# Patient Record
Sex: Male | Born: 1990 | Race: White | Hispanic: No | Marital: Single | Smoking: Never smoker
Health system: Southern US, Community
[De-identification: ages and names within clinical notes are randomized; demographics above are authoritative.]

---
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
## 2011-12-30 ENCOUNTER — Ambulatory Visit (HOSPITAL_COMMUNITY)
Admission: RE | Admit: 2011-12-30 | Discharge: 2011-12-30 | Disposition: A | Discharge status: Home / Self Care | Payer: 59 | Source: Ambulatory Visit | Attending: Family Medicine | Admitting: Family Medicine

## 2011-12-30 DIAGNOSIS — R059 Cough, unspecified: Secondary | ICD-10-CM | POA: Insufficient documentation

## 2011-12-30 DIAGNOSIS — R05 Cough: Secondary | ICD-10-CM | POA: Insufficient documentation

## 2011-12-30 MED ORDER — ALBUTEROL SULFATE (5 MG/ML) 0.5% IN NEBU: 2.5000 mg | INHALATION_SOLUTION | Freq: Once | RESPIRATORY_TRACT | Status: DC

## 2017-01-26 ENCOUNTER — Emergency Department (HOSPITAL_COMMUNITY)
Admission: EM | Admit: 2017-01-26 | Discharge: 2017-01-26 | Disposition: A | Discharge status: Home / Self Care | Payer: Worker's Compensation | Attending: Emergency Medicine | Admitting: Emergency Medicine

## 2017-01-26 ENCOUNTER — Other Ambulatory Visit: Payer: Self-pay | Admitting: Occupational Medicine

## 2017-01-26 ENCOUNTER — Ambulatory Visit: Payer: Self-pay

## 2017-01-26 ENCOUNTER — Encounter (HOSPITAL_COMMUNITY): Payer: Self-pay

## 2017-01-26 DIAGNOSIS — W268XXA Contact with other sharp object(s), not elsewhere classified, initial encounter: Secondary | ICD-10-CM | POA: Diagnosis not present

## 2017-01-26 DIAGNOSIS — Y9289 Other specified places as the place of occurrence of the external cause: Secondary | ICD-10-CM | POA: Diagnosis not present

## 2017-01-26 DIAGNOSIS — S81021A Laceration with foreign body, right knee, initial encounter: Secondary | ICD-10-CM | POA: Insufficient documentation

## 2017-01-26 DIAGNOSIS — Y999 Unspecified external cause status: Secondary | ICD-10-CM | POA: Diagnosis not present

## 2017-01-26 DIAGNOSIS — S80251A Superficial foreign body, right knee, initial encounter: Secondary | ICD-10-CM

## 2017-01-26 DIAGNOSIS — Y9389 Activity, other specified: Secondary | ICD-10-CM | POA: Diagnosis not present

## 2017-01-26 DIAGNOSIS — M25562 Pain in left knee: Secondary | ICD-10-CM

## 2017-01-26 MED ORDER — IBUPROFEN 400 MG PO TABS
600.0000 mg | ORAL_TABLET | Freq: Once | ORAL | Status: AC
  Administered 2017-01-26: 600 mg via ORAL
  Filled 2017-01-26: qty 1

## 2017-01-26 MED ORDER — LIDOCAINE HCL (PF) 1 % IJ SOLN
30.0000 mL | Freq: Once | INTRAMUSCULAR | Status: AC
  Administered 2017-01-26: 15 mL
  Filled 2017-01-26: qty 30

## 2017-01-26 MED ORDER — CEPHALEXIN 250 MG PO CAPS
500.0000 mg | ORAL_CAPSULE | Freq: Once | ORAL | Status: AC
  Administered 2017-01-26: 500 mg via ORAL
  Filled 2017-01-26: qty 2

## 2017-01-26 MED ORDER — LIDOCAINE HCL (PF) 1 % IJ SOLN
INTRAMUSCULAR | Status: AC
  Filled 2017-01-26: qty 15

## 2017-01-26 MED ORDER — CEPHALEXIN 500 MG PO CAPS: 500.0000 mg | ORAL_CAPSULE | Freq: Four times a day (QID) | ORAL | 0 refills | Status: DC

## 2017-01-26 NOTE — Discharge Instructions (Signed)
Your exam and x-ray today showed a foreign body in the soft tissue of your knee. We have removed the foreign body. You will need to take antibiotics in an effort to prevent infection. You will need to call Dr. Greig Right office to schedule follow up for Thursday. If you develop fever, increased redness, red streaks increased pain drainage from the wound or other problems, return immediately. Take ibuprofen as needed for pain.

## 2017-01-26 NOTE — ED Provider Notes (Signed)
[facility]-EMERGENCY DEPT Provider Note   CSN: [id number] Arrival date & time: 01/26/17  1630  By signing my name below, I, Linna Darner, attest that this documentation has been prepared under the direction and in the presence of [facility] M. Damian Leavell, NP. Electronically Signed: Linna Darner, Scribe. 01/26/2017. 5:23 PM.  History   Chief Complaint Chief Complaint  Patient presents with  . Knee Injury    The history is provided by the patient. No language interpreter was used.  Knee Pain   This is a new problem. The current episode started 3 to 5 hours ago. The problem occurs constantly. The problem has not changed since onset.The pain is present in the left knee. The quality of the pain is described as constant. The pain is mild. Pertinent negatives include no numbness and no tingling. He has tried nothing for the symptoms. There has been no history of extremity trauma. Family history is significant for no rheumatoid arthritis.     HPI Comments: Marcus Rosario is a 26 y.o. male who presents to the Emergency Department complaining of a left knee injury sustained a few hours ago at work. He states a shard of metal went into his left patella and lacerated the area. Pt reports some initial bleeding from the wound site which is now hemostatic as well as some mild pain secondary to the wound. He states his pain is worse with flexion of his left knee. No medications or treatments tried PTA. He was evaluated at Urgent Care earlier for the same and was advised to come to the ED after an x-ray showed radiodensities within the ventral soft tissues around the left knee. Tetanus status is UTD. He denies numbness/tingling, calf pain, or any other associated symptoms.  History reviewed. No pertinent past medical history.  There are no active problems to display for this patient.   History reviewed. No pertinent surgical history.    Home Medications    Prior to Admission medications   Medication Sig Start  Date End Date Taking? Authorizing Provider  cephALEXin (KEFLEX) 500 MG capsule Take 1 capsule (500 mg total) by mouth 4 (four) times daily. 01/26/17   Birdia Jaycox Orlene Och, NP    Family History No family history on file.  Social History Social History  Substance Use Topics  . Smoking status: Never Smoker  . Smokeless tobacco: Never Used  . Alcohol use Yes     Comment: Occasional      Allergies   Patient has no known allergies.   Review of Systems Review of Systems  Musculoskeletal: Positive for arthralgias. Negative for myalgias.  Skin: Positive for wound.  Neurological: Negative for tingling and numbness.  All other systems reviewed and are negative.  Physical Exam Updated Vital Signs BP (!) 145/64 (BP Location: Right Arm)   Pulse 63   Temp 98.4 F (36.9 C) (Oral)   Resp 16   Ht  (1.803 m)   Wt 79.4 kg   SpO2 99%   BMI 24.41 kg/m   Physical Exam  Constitutional: He is oriented to person, place, and time. He appears well-developed and well-nourished. No distress.  HENT:  Head: Normocephalic and atraumatic.  Eyes: Conjunctivae and EOM are normal.  Neck: Neck supple. No tracheal deviation present.  Cardiovascular: Normal rate.   Pulmonary/Chest: Effort normal.  Musculoskeletal: Normal range of motion.  1.5 cm laceration to the inferior aspect of the left patella. Pedal pulses 2+. Adequate circulation. Can fully extend the knee without pain but does have pain  with flexion. Foreign body is not visible at the entrance wound, there is no exit wound.  Neurological: He is alert and oriented to person, place, and time.  Skin: Skin is warm and dry. Laceration noted.  Psychiatric: He has a normal mood and affect. His behavior is normal.  Nursing note and vitals reviewed.  ED Treatments / Results: consult with Dr. Eulah Pont who request we remove the FB, irrigate with NSS and start antibiotics. He will see the patient for f/u in the office in 2 days.   Labs (all labs ordered  are listed, but only abnormal results are displayed) Labs Reviewed - No data to display  Radiology Dg Knee Complete 4 Views Left  Result Date: 01/26/2017 CLINICAL DATA:  Traumatic injury to the left knee. Evaluate for foreign body. EXAM: LEFT KNEE - COMPLETE 4+ VIEW COMPARISON:  None. FINDINGS: No joint effusion. There is a radio-opaque foreign body within the soft tissues of the ventral knee adjacent to or within the patellar tendon. No underlying fracture or subluxation identified. Three additional punctate radiodensities are identified within the ventral suprapatellar soft tissues. IMPRESSION: 1. Examination is positive for at least 4 radiodensities within the ventral soft tissues around the knee. The largest is either within or adjacent to the patellar tendon. Electronically Signed   By: Signa Kell M.D.   On: 01/26/2017 16:16    Procedures .Foreign Body Removal Date/Time: 01/26/2017 6:23 PM Performed by: Janne Napoleon Authorized by: Janne Napoleon  Consent: Verbal consent obtained. Risks and benefits: risks, benefits and alternatives were discussed Consent given by: patient Patient understanding: patient states understanding of the procedure being performed Imaging studies: imaging studies available Required items: required blood products, implants, devices, and special equipment available Patient identity confirmed: verbally with patient Body area: skin General location: lower extremity Location details: left knee Anesthesia: local infiltration  Anesthesia: Local Anesthetic: lidocaine 1% without epinephrine Anesthetic total: 7 mL  Sedation: Patient sedated: no Patient restrained: no Patient cooperative: yes Localization method: probed Removal mechanism: forceps Tendon involvement: none Depth: deep Complexity: complex 1 objects recovered. Objects recovered: 2 cm piece of metal Post-procedure assessment: foreign body removed Patient tolerance: Patient tolerated the  procedure well with no immediate complications Comments: Wound irrigated with 1,000 ccs NSS using a syringe. Wound left open. Tetanus status UTD. Will start on Keflex.   (including critical care time)  DIAGNOSTIC STUDIES: Oxygen Saturation is 98% on RA, normal by my interpretation.    COORDINATION OF CARE: 5:28 PM Discussed treatment plan with pt at bedside and pt agreed to plan.  Medications Ordered in ED Medications  lidocaine (PF) (XYLOCAINE) 1 % injection 30 mL (15 mLs Infiltration Given 01/26/17 1809)  cephALEXin (KEFLEX) capsule 500 mg (500 mg Oral Given 01/26/17 1853)  ibuprofen (ADVIL,MOTRIN) tablet 600 mg (600 mg Oral Given 01/26/17 1853)     Initial Impression / Assessment and Plan / ED Course  I have reviewed the triage vital signs and the nursing notes.  Pertinent  imaging results that were available during my care of the patient were reviewed by me and considered in my medical decision making (see chart for details).    Final Clinical Impressions(s) / ED Diagnoses  26 y.o. male with FB to the left knee resulting from injury at work stable for d/c without focal neuro deficits and foreign body removed. Discussed with the patient findings and plan of care and all questioned fully answered. He agrees with plan.   Final diagnoses:  Foreign body of  knee, right, initial encounter    New Prescriptions Discharge Medication List as of 01/26/2017  6:39 PM    START taking these medications   Details  cephALEXin (KEFLEX) 500 MG capsule Take 1 capsule (500 mg total) by mouth 4 (four) times daily., Starting Tue 01/26/2017, Print       I personally performed the services described in this documentation, which was scribed in my presence. The recorded information has been reviewed and is accurate.    [address] [place], [place] 01/27/17 5621    Maia Plan, MD 01/27/17 [phone]

## 2017-01-26 NOTE — ED Triage Notes (Signed)
Per Pt, Pt was at work when a shard of metal went into his left knee cap. Pt reports going to UC and being sent here for the removal of the metal. Reports they took an XRAY and metal was right below the surface of the skin.

## 2017-01-29 ENCOUNTER — Emergency Department (HOSPITAL_COMMUNITY)
Admission: EM | Admit: 2017-01-29 | Discharge: 2017-01-29 | Disposition: A | Discharge status: Home / Self Care | Payer: Worker's Compensation | Attending: Physician Assistant | Admitting: Physician Assistant

## 2017-01-29 ENCOUNTER — Encounter (HOSPITAL_COMMUNITY): Payer: Self-pay | Admitting: *Deleted

## 2017-01-29 DIAGNOSIS — Z4801 Encounter for change or removal of surgical wound dressing: Secondary | ICD-10-CM | POA: Insufficient documentation

## 2017-01-29 DIAGNOSIS — Z5189 Encounter for other specified aftercare: Secondary | ICD-10-CM

## 2017-01-29 NOTE — ED Notes (Signed)
PA at the bedside.

## 2017-01-29 NOTE — ED Provider Notes (Signed)
[facility]   CSN: [id number] Arrival date & time: 01/29/17  1050  By signing my name below, I, [name], attest that this documentation has been prepared under the direction and in the presence of Buel Ream PA-C Electronically Signed: [name], Scribe. 01/29/17. 2:32 PM. History   Chief Complaint No chief complaint on file.   HPI Comments: Marcus Rosario is a 26 y.o. male who presents to the Emergency Department requesting wound check today. He was seen on 01/26/17 c/o a left knee injury sustained at work when a shard of metal entered his left patella and lacerated the knee. He has minimal pain that is present with walking for extended periods of time. He states he had an appointment with Dr. Eulah Pont at 11:00 am today but misunderstood the location and accidentally came to the ED. He denies associated fever, numbness or tingling.  The history is provided by the patient. No language interpreter was used.    History reviewed. No pertinent past medical history.  There are no active problems to display for this patient.   History reviewed. No pertinent surgical history.     Home Medications    Prior to Admission medications   Medication Sig Start Date End Date Taking? Authorizing Provider  cephALEXin (KEFLEX) 500 MG capsule Take 1 capsule (500 mg total) by mouth 4 (four) times daily. 01/26/17   Hope Orlene Och, NP    Family History No family history on file.  Social History Social History  Substance Use Topics  . Smoking status: Never Smoker  . Smokeless tobacco: Never Used  . Alcohol use Yes     Comment: Occasional      Allergies   Patient has no known allergies.   Review of Systems Review of Systems  Constitutional: Negative for fever.  Skin: Positive for wound.  Neurological: Negative for numbness.     Physical Exam Updated Vital Signs BP (!) 120/47 (BP Location: Left Arm)   Pulse (!) 55   Temp 98.5 F (36.9 C)  (Oral)   Resp 14   Ht  (1.803 m)   Wt 79.4 kg   SpO2 99%   BMI 24.41 kg/m   Physical Exam  Constitutional: He appears well-developed and well-nourished. No distress.  HENT:  Head: Normocephalic and atraumatic.  Mouth/Throat: Oropharynx is clear and moist. No oropharyngeal exudate.  Eyes: Conjunctivae are normal. Pupils are equal, round, and reactive to light. Right eye exhibits no discharge. Left eye exhibits no discharge. No scleral icterus.  Neck: Normal range of motion. Neck supple. No thyromegaly present.  Cardiovascular: Normal rate, regular rhythm, normal heart sounds and intact distal pulses.  Exam reveals no gallop and no friction rub.   No murmur heard. Pulmonary/Chest: Effort normal and breath sounds normal. No stridor. No respiratory distress. He has no wheezes. He has no rales.  Abdominal: Soft. Bowel sounds are normal. He exhibits no distension. There is no tenderness. There is no rebound and no guarding.  Musculoskeletal: He exhibits no edema.  Lymphadenopathy:    He has no cervical adenopathy.  Neurological: He is alert. Coordination normal.  Skin: Skin is warm and dry. No rash noted. He is not diaphoretic. No pallor.  Well appearing, healing wound to anterior left knee; no erythema, edema, drainage; mild tenderness; FROM of knee  Psychiatric: He has a normal mood and affect.  Nursing note and vitals reviewed.    ED Treatments / Results  DIAGNOSTIC STUDIES: Oxygen Saturation is 98% on RA,  normal by my interpretation.    COORDINATION OF CARE: 2:04 PM Discussed treatment plan with pt at bedside and pt agreed to plan.   Labs (all labs ordered are listed, but only abnormal results are displayed) Labs Reviewed - No data to display  EKG  EKG Interpretation None       Radiology No results found.  Procedures Procedures (including critical care time)  Medications Ordered in ED Medications - No data to display   Initial Impression / Assessment and  Plan / ED Course  I have reviewed the triage vital signs and the nursing notes.  Pertinent labs & imaging results that were available during my care of the patient were reviewed by me and considered in my medical decision making (see chart for details).     Patient returns for check of FB removal. The region appears to be well-healing without signs of infection. Patient symptoms improved from prior visit. Afebrile and hemodynamically stable. Pt is instructed to follow up with Dr. Eulah Pont as directed, as this was written in the previous visit's note as "strict orthopedic follow-up." Pt has a good understanding of return precautions and is safe for discharge at this time.   Final Clinical Impressions(s) / ED Diagnoses   Final diagnoses:  Visit for wound check    New Prescriptions Discharge Medication List as of 01/29/2017  2:17 PM    I personally performed the services described in this documentation, which was scribed in my presence. The recorded information has been reviewed and is accurate.     Emi Holes, PA-C 01/30/17 1704    Courteney Randall An, MD 02/09/17 1409

## 2017-01-29 NOTE — ED Triage Notes (Signed)
Pt here for reeval of L knee, pt seen here 4/17 for removal of a metal piece out of L knee from injury at work per pt, pt ambulatory, denies fever, chills, redness & drainage from the area, A&O x4

## 2018-03-29 ENCOUNTER — Encounter (HOSPITAL_BASED_OUTPATIENT_CLINIC_OR_DEPARTMENT_OTHER): Payer: 59 | Attending: Internal Medicine

## 2018-03-29 DIAGNOSIS — Z87828 Personal history of other (healed) physical injury and trauma: Secondary | ICD-10-CM | POA: Insufficient documentation

## 2019-02-22 ENCOUNTER — Other Ambulatory Visit: Payer: Self-pay

## 2019-02-22 ENCOUNTER — Encounter (HOSPITAL_COMMUNITY): Payer: Self-pay | Admitting: *Deleted

## 2019-02-22 ENCOUNTER — Emergency Department (HOSPITAL_COMMUNITY)
Admission: EM | Admit: 2019-02-22 | Discharge: 2019-02-22 | Disposition: A | Discharge status: Home / Self Care | Payer: No Typology Code available for payment source | Attending: Emergency Medicine | Admitting: Emergency Medicine

## 2019-02-22 ENCOUNTER — Emergency Department (HOSPITAL_COMMUNITY): Payer: No Typology Code available for payment source

## 2019-02-22 DIAGNOSIS — Y990 Civilian activity done for income or pay: Secondary | ICD-10-CM | POA: Insufficient documentation

## 2019-02-22 DIAGNOSIS — S6992XA Unspecified injury of left wrist, hand and finger(s), initial encounter: Secondary | ICD-10-CM

## 2019-02-22 DIAGNOSIS — S61317A Laceration without foreign body of left little finger with damage to nail, initial encounter: Secondary | ICD-10-CM | POA: Insufficient documentation

## 2019-02-22 DIAGNOSIS — W231XXA Caught, crushed, jammed, or pinched between stationary objects, initial encounter: Secondary | ICD-10-CM | POA: Diagnosis not present

## 2019-02-22 DIAGNOSIS — Y9289 Other specified places as the place of occurrence of the external cause: Secondary | ICD-10-CM | POA: Insufficient documentation

## 2019-02-22 DIAGNOSIS — Z23 Encounter for immunization: Secondary | ICD-10-CM | POA: Insufficient documentation

## 2019-02-22 DIAGNOSIS — Y9389 Activity, other specified: Secondary | ICD-10-CM | POA: Diagnosis not present

## 2019-02-22 DIAGNOSIS — S62637A Displaced fracture of distal phalanx of left little finger, initial encounter for closed fracture: Secondary | ICD-10-CM

## 2019-02-22 DIAGNOSIS — S67197A Crushing injury of left little finger, initial encounter: Secondary | ICD-10-CM | POA: Diagnosis present

## 2019-02-22 MED ORDER — HYDROCODONE-ACETAMINOPHEN 5-325 MG PO TABS: 1.0000 | ORAL_TABLET | Freq: Four times a day (QID) | ORAL | 0 refills | Status: AC | PRN

## 2019-02-22 MED ORDER — ONDANSETRON HCL 4 MG/2ML IJ SOLN
4.0000 mg | Freq: Once | INTRAMUSCULAR | Status: AC
  Administered 2019-02-22: 4 mg via INTRAVENOUS
  Filled 2019-02-22: qty 2

## 2019-02-22 MED ORDER — CEPHALEXIN 500 MG PO CAPS: 500.0000 mg | ORAL_CAPSULE | Freq: Four times a day (QID) | ORAL | 0 refills | Status: AC

## 2019-02-22 MED ORDER — TETANUS-DIPHTH-ACELL PERTUSSIS 5-2.5-18.5 LF-MCG/0.5 IM SUSP
0.5000 mL | Freq: Once | INTRAMUSCULAR | Status: AC
  Administered 2019-02-22: 0.5 mL via INTRAMUSCULAR
  Filled 2019-02-22: qty 0.5

## 2019-02-22 MED ORDER — SODIUM CHLORIDE 0.9 % IV BOLUS
1000.0000 mL | Freq: Once | INTRAVENOUS | Status: AC
  Administered 2019-02-22: 1000 mL via INTRAVENOUS

## 2019-02-22 MED ORDER — CEFAZOLIN SODIUM-DEXTROSE 2-4 GM/100ML-% IV SOLN
2.0000 g | Freq: Once | INTRAVENOUS | Status: AC
  Administered 2019-02-22: 2 g via INTRAVENOUS
  Filled 2019-02-22: qty 100

## 2019-02-22 MED ORDER — LIDOCAINE HCL 2 % IJ SOLN
10.0000 mL | Freq: Once | INTRAMUSCULAR | Status: AC
  Administered 2019-02-22: 20:00:00 180 mg via INTRADERMAL
  Filled 2019-02-22: qty 20

## 2019-02-22 NOTE — ED Triage Notes (Signed)
PT reports crush injury to Lt small finger. Pt reports he needs a tetanus shot.

## 2019-02-22 NOTE — ED Provider Notes (Signed)
[facility] EMERGENCY DEPARTMENT Provider Note   CSN: [id number] Arrival date & time: 02/22/19  1647    History   Chief Complaint Chief Complaint  Patient presents with  . Finger Injury    HPI Marcus Rosario is a 28 y.o. male.     HPI   28 year old male with no past medical history presents emergency department today for evaluation of crush injury to the left fifth finger that occurred about 3 hours prior to arrival.  Reports open wound to the finger.  Has some decreased sensation to the distal tip of the finger but otherwise has normal sensation.  Denies any other injuries.  Pain is severe and constant in nature.  Is worse with palpation and movement of his finger.  He does not know when his last tetanus shot was.  History reviewed. No pertinent past medical history.  There are no active problems to display for this patient.   History reviewed. No pertinent surgical history.      Home Medications    Prior to Admission medications   Medication Sig Start Date End Date Taking? Authorizing Provider  cephALEXin (KEFLEX) 500 MG capsule Take 1 capsule (500 mg total) by mouth 4 (four) times daily for 10 days. 02/22/19 03/04/19  Keithan Dileonardo S, PA-C  HYDROcodone-acetaminophen (NORCO/VICODIN) 5-325 MG tablet Take 1 tablet by mouth every 6 (six) hours as needed. 02/22/19   Dezzie Badilla S, PA-C    Family History History reviewed. No pertinent family history.  Social History Social History   Tobacco Use  . Smoking status: Never Smoker  . Smokeless tobacco: Never Used  Substance Use Topics  . Alcohol use: Yes    Comment: Occasional   . Drug use: No     Allergies   Patient has no known allergies.   Review of Systems Review of Systems  Constitutional: Negative for fever.  Respiratory: Negative for shortness of breath.   Cardiovascular: Negative for chest pain.  Musculoskeletal:       Left 5th finger pain  Neurological: Positive for  numbness. Negative for weakness.     Physical Exam Updated Vital Signs BP 122/71   Pulse 62   Temp 98.3 F (36.8 C) (Oral)   Resp 14   Ht 5\' 11"  (1.803 m)   Wt 74.8 kg   SpO2 100%   BMI 23.01 kg/m   Physical Exam Constitutional:      General: He is not in acute distress.    Appearance: He is well-developed.  Eyes:     Conjunctiva/sclera: Conjunctivae normal.  Cardiovascular:     Rate and Rhythm: Normal rate.  Pulmonary:     Effort: Pulmonary effort is normal.     Breath sounds: Normal breath sounds.  Musculoskeletal:     Comments: Flexion/extension is intact at the DIP, PIP and MCP joints.  Patient has deep wound to left fifth digit on the ventral aspect.  The nail is also no longer attached and there is a laceration through the nail bed.  Sensation decreased to the distal tip of the finger though there is brisk cap refill.  Sensation is otherwise normal. (pictured below)  Skin:    General: Skin is warm and dry.  Neurological:     Mental Status: He is alert and oriented to person, place, and time.            ED Treatments / Results  Labs (all labs ordered are listed, but only abnormal results are displayed) Labs Reviewed -  No data to display  EKG None  Radiology Dg Finger Little Left  Result Date: 02/22/2019 CLINICAL DATA:  Crush injury EXAM: LEFT LITTLE FINGER 2+V COMPARISON:  None. FINDINGS: There is an acute comminuted displaced fracture involving the distal phalanx of the fifth multiple fracture fragments are noted within the palmar soft tissues distally. Digit. A soft tissue defect is noted at the distal aspect of the fifth digit. IMPRESSION: Acute comminuted displaced fracture involving the distal phalanx of the fifth digit. There is a significant associated soft tissue defect. Electronically Signed   By: Katherine Mantlehristopher  Green M.D.   On: 02/22/2019 18:34    Procedures .Marland Kitchen.Laceration Repair Date/Time: 02/22/2019 8:38 PM Performed by: Karrie Meresouture, Deena Shaub S, PA-C  Authorized by: Karrie Meresouture, Kendall Arnell S, PA-C   Consent:    Consent obtained:  Verbal   Consent given by:  Patient   Risks discussed:  Infection, pain, tendon damage, vascular damage, poor wound healing and need for additional repair Anesthesia (see MAR for exact dosages):    Anesthesia method:  Nerve block   Block anesthetic:  Lidocaine 2% w/o epi   Block injection procedure:  Anatomic landmarks identified, introduced needle, incremental injection, negative aspiration for blood and anatomic landmarks palpated   Block outcome:  Anesthesia achieved Laceration details:    Location:  Finger   Finger location:  L small finger   Length (cm):  1.5 Repair type:    Repair type:  Simple Pre-procedure details:    Preparation:  Patient was prepped and draped in usual sterile fashion and imaging obtained to evaluate for foreign bodies Exploration:    Hemostasis achieved with:  Direct pressure   Wound exploration: wound explored through full range of motion and entire depth of wound probed and visualized     Wound extent: nerve damage and underlying fracture     Wound extent: no foreign bodies/material noted     Contaminated: no   Treatment:    Area cleansed with:  Saline   Amount of cleaning:  Extensive   Irrigation solution:  Sterile saline   Irrigation volume:  2L   Irrigation method:  Pressure wash   Visualized foreign bodies/material removed: no   Skin repair:    Repair method:  Sutures   Suture size:  5-0   Suture material:  Prolene   Suture technique:  Simple interrupted   Number of sutures:  4 Approximation:    Approximation:  Loose Post-procedure details:    Dressing:  Non-adherent dressing, splint for protection and tube gauze   Patient tolerance of procedure:  Tolerated well, no immediate complications   (including critical care time)  Medications Ordered in ED Medications  ceFAZolin (ANCEF) IVPB 2g/100 mL premix (0 g Intravenous Stopped 02/22/19 1855)  sodium chloride 0.9 %  bolus 1,000 mL (0 mLs Intravenous Stopped 02/22/19 2010)  ondansetron (ZOFRAN) injection 4 mg (4 mg Intravenous Given 02/22/19 1739)  Tdap (BOOSTRIX) injection 0.5 mL (0.5 mLs Intramuscular Given 02/22/19 1741)  lidocaine (XYLOCAINE) 2 % (with pres) injection 200 mg (180 mg Intradermal Given by Other 02/22/19 2013)     Initial Impression / Assessment and Plan / ED Course  I have reviewed the triage vital signs and the nursing notes.  Pertinent labs & imaging results that were available during my care of the patient were reviewed by me and considered in my medical decision making (see chart for details).     Final Clinical Impressions(s) / ED Diagnoses   Final diagnoses:  Injury of finger of left  hand, initial encounter  Closed displaced fracture of distal phalanx of left little finger, initial encounter   Patient with crush injury to the left pinky finger that occurred just prior to arrival.  Flexion/extension is intact at the DIP, PIP and MCP joints.  Patient has deep wound to left fifth digit on the ventral aspect.  The nail is also no longer attached and there is a laceration through the nail bed.  Sensation decreased to the distal tip of the finger though there is brisk cap refill.  Sensation is otherwise normal.   Xray shows Acute comminuted displaced fracture involving the distal phalanx of the fifth digit. There is a significant associated soft tissue defect.   7:20 PM consult with Dr. Melvyn Novas who recommends washout, placing a dressing on it and having the patient follow-up in the office tomorrow.  Tdap updated, patient given Ancef.  Wound was irrigated copiously. Because there was a gaping wound to the patient's left fifth digit, 4 sutures were placed with loose approximation. A dressing was applied and splint was placed.  Patient will be given Rx for Keflex and pain medications.  He was advised to call Dr. Glenna Durand office tomorrow morning to be seen in the office.  Advised to return  to the ER for new or worsening symptoms.  He voices understanding of the plan and reasons return.  All questions answered.  Patient stable discharge.  ED Discharge Orders         Ordered    cephALEXin (KEFLEX) 500 MG capsule  4 times daily     02/22/19 2009    HYDROcodone-acetaminophen (NORCO/VICODIN) 5-325 MG tablet  Every 6 hours PRN     02/22/19 [address], [place] 02/22/19 2040    Pricilla Loveless, MD 02/23/19 1845

## 2019-02-22 NOTE — Progress Notes (Signed)
Orthopedic Tech Progress Note Patient Details:  [place] 1991/09/14 [id number]  Ortho Devices Type of Ortho Device: Finger splint Ortho Device/Splint Location: lue 5th finger  Ortho Device/Splint Interventions: Ordered, Application, Adjustment   Post Interventions Patient Tolerated: Well Instructions Provided: Care of device, Adjustment of device   Trinna Post 02/22/2019, 9:10 PM

## 2019-02-22 NOTE — Discharge Instructions (Addendum)
Please take the antibiotics as prescribed.  Prescription given for Norco. Take medication as directed and do not operate machinery, drive a car, or work while taking this medication as it can make you drowsy.   Please call Dr. Glenna Durand office in the morning to make an appointment for follow-up tomorrow.  You will need to have the sutures removed in 7 to 10 days if they are not taken out of the office tomorrow.  Return to the emergency department for any new or worsening symptoms including signs of infection.

## 2019-10-10 ENCOUNTER — Ambulatory Visit: Payer: 59 | Attending: Internal Medicine

## 2019-10-10 DIAGNOSIS — Z20822 Contact with and (suspected) exposure to covid-19: Secondary | ICD-10-CM

## 2019-10-11 LAB — NOVEL CORONAVIRUS, NAA: SARS-CoV-2, NAA: NOT DETECTED

## 2019-11-23 ENCOUNTER — Ambulatory Visit: Payer: 59 | Attending: Internal Medicine

## 2019-11-23 DIAGNOSIS — Z20822 Contact with and (suspected) exposure to covid-19: Secondary | ICD-10-CM

## 2019-11-24 LAB — NOVEL CORONAVIRUS, NAA: SARS-CoV-2, NAA: DETECTED — AB

## 2021-02-20 IMAGING — DX LEFT LITTLE FINGER 2+V
3 series · 3 of 3 positions shown · non-contrast
Comparison: None.

CLINICAL DATA: Crush injury

EXAM:
LEFT LITTLE FINGER 2+V

[finger ap]
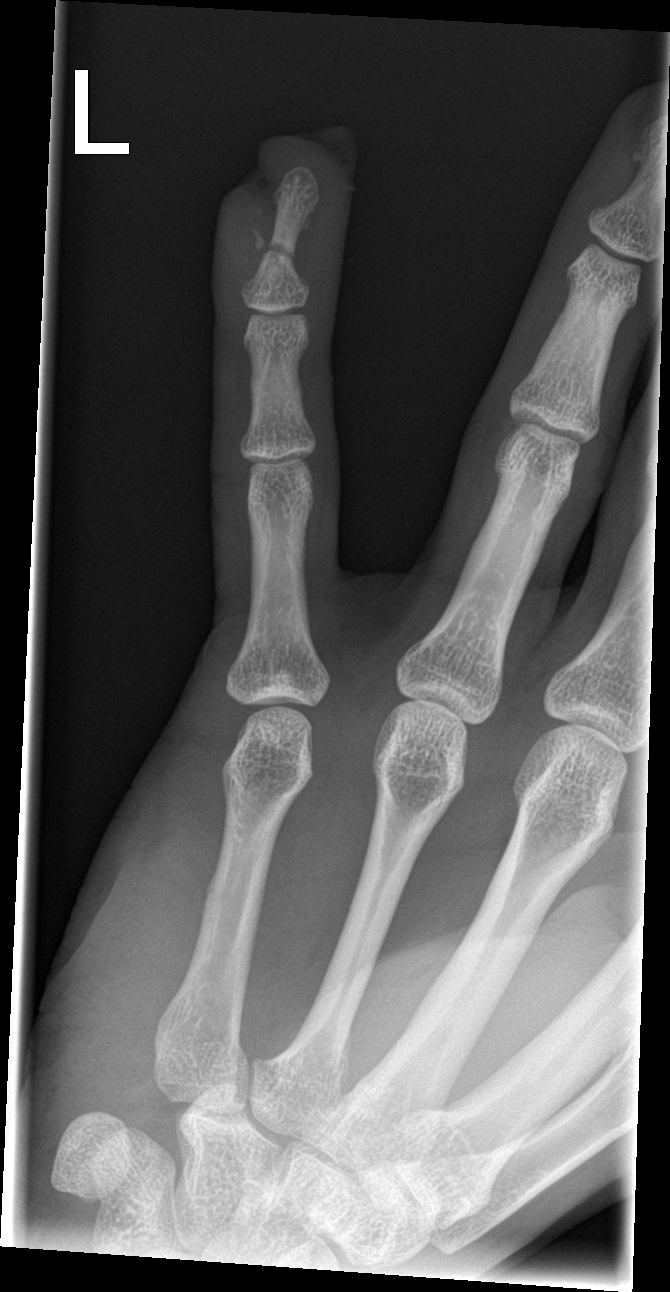

[finger obl]
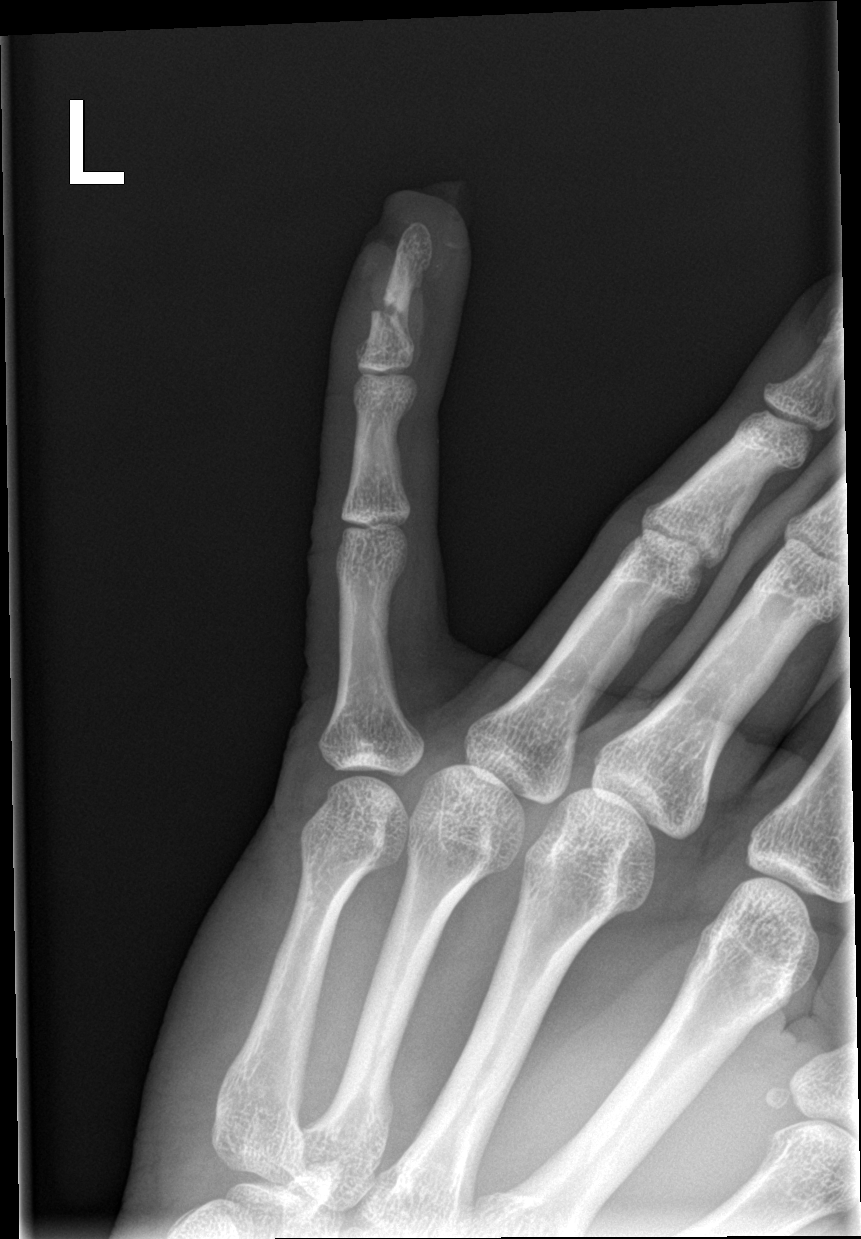

[finger lat]
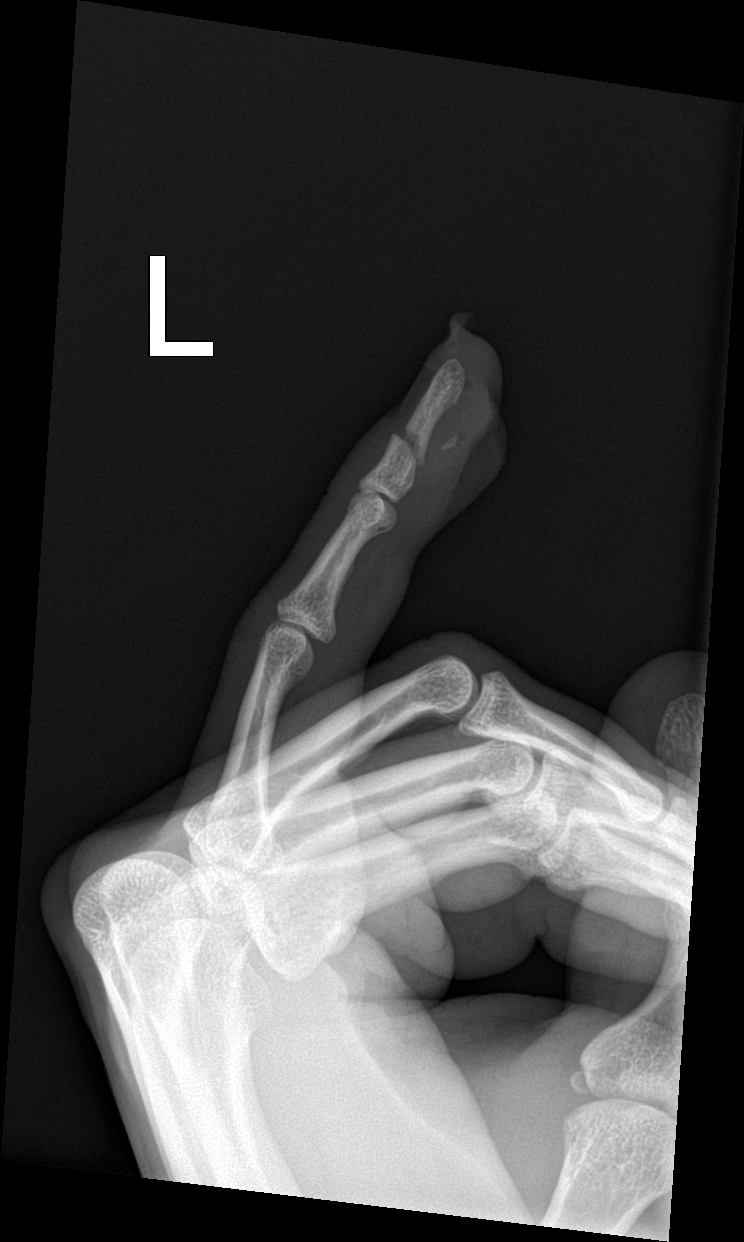

[3 of 3 positions shown; findings below may reference images not displayed]

FINDINGS: There is an acute comminuted displaced fracture involving the distal
phalanx of the fifth multiple fracture fragments are noted within
the palmar soft tissues distally. Digit. A soft tissue defect is
noted at the distal aspect of the fifth digit.
IMPRESSION: Acute comminuted displaced fracture involving the distal phalanx of
the fifth digit. There is a significant associated soft tissue
defect.
